# Patient Record
Sex: Male | Born: 1990 | Race: White | Hispanic: No | Smoking: Current every day smoker
Health system: Southern US, Community
[De-identification: ages and names within clinical notes are randomized; demographics above are authoritative.]

## PROBLEM LIST (undated history)

## (undated) HISTORY — PX: WRIST SURGERY: SHX841

---
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
## 2013-05-11 ENCOUNTER — Encounter (HOSPITAL_COMMUNITY): Payer: Self-pay | Admitting: *Deleted

## 2013-05-11 ENCOUNTER — Emergency Department (HOSPITAL_COMMUNITY): Payer: BC Managed Care – PPO

## 2013-05-11 ENCOUNTER — Emergency Department (HOSPITAL_COMMUNITY)
Admission: EM | Admit: 2013-05-11 | Discharge: 2013-05-11 | Disposition: A | Discharge status: Home / Self Care | Payer: BC Managed Care – PPO | Attending: Emergency Medicine | Admitting: Emergency Medicine

## 2013-05-11 DIAGNOSIS — R42 Dizziness and giddiness: Secondary | ICD-10-CM | POA: Insufficient documentation

## 2013-05-11 DIAGNOSIS — S93609A Unspecified sprain of unspecified foot, initial encounter: Secondary | ICD-10-CM | POA: Insufficient documentation

## 2013-05-11 DIAGNOSIS — F17200 Nicotine dependence, unspecified, uncomplicated: Secondary | ICD-10-CM | POA: Insufficient documentation

## 2013-05-11 DIAGNOSIS — S93602A Unspecified sprain of left foot, initial encounter: Secondary | ICD-10-CM

## 2013-05-11 DIAGNOSIS — T07XXXA Unspecified multiple injuries, initial encounter: Secondary | ICD-10-CM

## 2013-05-11 DIAGNOSIS — S0990XA Unspecified injury of head, initial encounter: Secondary | ICD-10-CM | POA: Insufficient documentation

## 2013-05-11 DIAGNOSIS — S8002XA Contusion of left knee, initial encounter: Secondary | ICD-10-CM

## 2013-05-11 DIAGNOSIS — S8000XA Contusion of unspecified knee, initial encounter: Secondary | ICD-10-CM | POA: Insufficient documentation

## 2013-05-11 DIAGNOSIS — Y92410 Unspecified street and highway as the place of occurrence of the external cause: Secondary | ICD-10-CM | POA: Insufficient documentation

## 2013-05-11 DIAGNOSIS — IMO0002 Reserved for concepts with insufficient information to code with codable children: Secondary | ICD-10-CM | POA: Insufficient documentation

## 2013-05-11 DIAGNOSIS — Y9389 Activity, other specified: Secondary | ICD-10-CM | POA: Insufficient documentation

## 2013-05-11 MED ORDER — IBUPROFEN 600 MG PO TABS: 600.0000 mg | ORAL_TABLET | Freq: Four times a day (QID) | ORAL | Status: DC | PRN

## 2013-05-11 MED ORDER — HYDROCODONE-ACETAMINOPHEN 5-325 MG PO TABS: 1.0000 | ORAL_TABLET | Freq: Four times a day (QID) | ORAL | Status: DC | PRN

## 2013-05-11 MED ORDER — LIDOCAINE-EPINEPHRINE-TETRACAINE (LET) SOLUTION
3.0000 mL | Freq: Once | NASAL | Status: AC
  Administered 2013-05-11: 3 mL via TOPICAL
  Filled 2013-05-11: qty 3

## 2013-05-11 NOTE — ED Provider Notes (Signed)
Medical screening examination/treatment/procedure(s) were performed by non-physician practitioner and as supervising physician I was immediately available for consultation/collaboration.  Dillen Belmontes N Benjamine Strout, DO 05/11/13 1652 
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
## 2013-05-12 ENCOUNTER — Telehealth: Payer: Self-pay | Admitting: Family

## 2013-05-12 NOTE — Telephone Encounter (Signed)
Please call pt and arrange an ED follow up this week.

## 2013-05-13 NOTE — Telephone Encounter (Signed)
Left message for patient to return my call.

## 2013-05-15 NOTE — Telephone Encounter (Signed)
Patient scheduled ED follow up for 05/16/13

## 2013-05-16 ENCOUNTER — Ambulatory Visit (INDEPENDENT_AMBULATORY_CARE_PROVIDER_SITE_OTHER): Payer: Self-pay | Admitting: Family

## 2013-05-16 DIAGNOSIS — Z0289 Encounter for other administrative examinations: Secondary | ICD-10-CM

## 2013-05-16 NOTE — Progress Notes (Signed)
  Subjective:    Patient ID: Daryl Wright, male    DOB: 01-Jul-1991, 22 y.o.   MRN: [id number]  HPI  Pt no showed.  Review of Systems     Objective:   Physical Exam        Assessment & Plan:

## 2013-08-30 ENCOUNTER — Emergency Department (INDEPENDENT_AMBULATORY_CARE_PROVIDER_SITE_OTHER)
Admission: EM | Admit: 2013-08-30 | Discharge: 2013-08-30 | Disposition: A | Discharge status: Home / Self Care | Payer: BC Managed Care – PPO | Source: Home / Self Care | Attending: Emergency Medicine | Admitting: Emergency Medicine

## 2013-08-30 ENCOUNTER — Encounter (HOSPITAL_COMMUNITY): Payer: Self-pay | Admitting: Emergency Medicine

## 2013-08-30 DIAGNOSIS — R091 Pleurisy: Secondary | ICD-10-CM

## 2013-08-30 DIAGNOSIS — J40 Bronchitis, not specified as acute or chronic: Secondary | ICD-10-CM

## 2013-08-30 DIAGNOSIS — J029 Acute pharyngitis, unspecified: Secondary | ICD-10-CM

## 2013-08-30 MED ORDER — SODIUM CHLORIDE 0.9 % IN NEBU
INHALATION_SOLUTION | RESPIRATORY_TRACT | Status: AC
  Filled 2013-08-30: qty 3

## 2013-08-30 MED ORDER — IPRATROPIUM BROMIDE 0.02 % IN SOLN
RESPIRATORY_TRACT | Status: AC
  Filled 2013-08-30: qty 2.5

## 2013-08-30 MED ORDER — AZITHROMYCIN 250 MG PO TABS: ORAL_TABLET | ORAL | Status: DC

## 2013-08-30 MED ORDER — ALBUTEROL SULFATE HFA 108 (90 BASE) MCG/ACT IN AERS: 1.0000 | INHALATION_SPRAY | RESPIRATORY_TRACT | Status: DC | PRN

## 2013-08-30 MED ORDER — IPRATROPIUM BROMIDE 0.02 % IN SOLN
0.5000 mg | Freq: Once | RESPIRATORY_TRACT | Status: AC
  Administered 2013-08-30: 0.5 mg via RESPIRATORY_TRACT

## 2013-08-30 MED ORDER — HYDROCOD POLST-CHLORPHEN POLST 10-8 MG/5ML PO LQCR: 5.0000 mL | Freq: Every evening | ORAL | Status: DC | PRN

## 2013-08-30 MED ORDER — ALBUTEROL SULFATE (5 MG/ML) 0.5% IN NEBU
5.0000 mg | INHALATION_SOLUTION | Freq: Once | RESPIRATORY_TRACT | Status: AC
  Administered 2013-08-30: 5 mg via RESPIRATORY_TRACT

## 2013-08-30 MED ORDER — ALBUTEROL SULFATE (5 MG/ML) 0.5% IN NEBU
INHALATION_SOLUTION | RESPIRATORY_TRACT | Status: AC
  Filled 2013-08-30: qty 1

## 2013-08-30 NOTE — ED Notes (Signed)
C/O sore throat and "difficulty expanding chest to breath".  "Feels like I have a decreased lung capacity".  C/O slight cough with yellow sputum.  Unsure if fevers.  Has taken Tyl - last dose @ 1130.  Oropharynx very red.

## 2013-08-30 NOTE — ED Provider Notes (Signed)
Medical screening examination/treatment/procedure(s) were performed by non-physician practitioner and as supervising physician I was immediately available for consultation/collaboration.  Mahagony Grieb, M.D.  Dariush Mcnellis C Hiroshi Krummel, MD 08/30/13 2057 

## 2013-08-30 NOTE — ED Notes (Signed)
?   ronchi in right lower lung.

## 2013-08-30 NOTE — ED Provider Notes (Signed)
CSN: [id number]     Arrival date & time 08/30/13  1140 History   First MD Initiated Contact with Patient 08/30/13 1255     Chief Complaint  Patient presents with  . Sore Throat  . Cough   HPI: Patient is a 22 y.o. male presenting with cough. The history is provided by the patient.  Cough Cough characteristics:  Productive Sputum characteristics:  Yellow Severity:  Severe Onset quality:  Gradual Duration:  1 week Timing:  Constant Progression:  Worsening Chronicity:  New Smoker: no   Context: upper respiratory infection   Worsened by:  Deep breathing and lying down Associated symptoms: diaphoresis, fever, myalgias, shortness of breath and sore throat   Associated symptoms: no chest pain, no chills, no ear fullness, no ear pain, no eye discharge, no rhinorrhea and no wheezing   Pt reports onset of persistent cough and sore throat approx 1 week ago that has worsened. The cough is productive of thick yellow secretions and is worse at night or when lying down. States she has coughed so much that he has pain to his upper back and lower rib area when coughing or deep breathing. States at times he feels like he can not get enough air into his lungs. Pt is non-smoker. Possible subjective fever as pt has had lots of sweating and has felt very warm at times. Denies other associated URI sx's such as runny nose, ear pain, or sinus congestion. Admits to remote h/o bronchitis. No h/o asthma.   History reviewed. No pertinent past medical history. History reviewed. No pertinent past surgical history. No family history on file. History  Substance Use Topics  . Smoking status: Former Games developer  . Smokeless tobacco: Never Used  . Alcohol Use: No    Review of Systems  Constitutional: Positive for fever and diaphoresis. Negative for chills.  HENT: Positive for sore throat. Negative for congestion, ear discharge, ear pain, facial swelling, postnasal drip, rhinorrhea, sinus pressure and sneezing.   Eyes:  Negative.  Negative for discharge.  Respiratory: Positive for cough and shortness of breath. Negative for wheezing.   Cardiovascular: Negative for chest pain.  Gastrointestinal: Negative for nausea, vomiting, abdominal pain and diarrhea.  Endocrine: Negative.   Genitourinary: Negative.   Musculoskeletal: Positive for myalgias.  Allergic/Immunologic: Negative for environmental allergies.  Neurological: Negative.   Hematological: Negative.   Psychiatric/Behavioral: Negative.     Allergies  Review of patient's allergies indicates no known allergies.  Home Medications   Current Outpatient Rx  Name  Route  Sig  Dispense  Refill  . HYDROcodone-acetaminophen (NORCO) 5-325 MG per tablet   Oral   Take 1 tablet by mouth every 6 (six) hours as needed for pain.   20 tablet   0   . ibuprofen (ADVIL,MOTRIN) 600 MG tablet   Oral   Take 1 tablet (600 mg total) by mouth every 6 (six) hours as needed for pain.   30 tablet   0    BP 145/80  Pulse 64  Temp(Src) 98 F (36.7 C) (Oral)  Resp 18  SpO2 98% Physical Exam  Constitutional: He is oriented to person, place, and time. He appears well-developed and well-nourished.  HENT:  Head: Normocephalic and atraumatic.  Right Ear: Tympanic membrane, external ear and ear canal normal.  Left Ear: Tympanic membrane, external ear and ear canal normal.  Nose: Nose normal. Right sinus exhibits no maxillary sinus tenderness and no frontal sinus tenderness. Left sinus exhibits no maxillary sinus tenderness and no frontal  sinus tenderness.  Mouth/Throat: Uvula is midline and mucous membranes are normal. Posterior oropharyngeal erythema present. No posterior oropharyngeal edema.  Eyes: Conjunctivae are normal.  Cardiovascular: Normal rate and regular rhythm.   Pulmonary/Chest: Effort normal and breath sounds normal.  Freq persistent, spastic type cough w/ deep inspiration.  Musculoskeletal: Normal range of motion.  Lymphadenopathy:    He has cervical  adenopathy.  Neurological: He is alert and oriented to person, place, and time.  Skin: Skin is warm and dry.  Psychiatric: He has a normal mood and affect.    ED Course  Procedures (including critical care time) Labs Review Labs Reviewed  POCT RAPID STREP A ([facility] ONLY)   Imaging Review No results found.  EKG Interpretation    Date/Time:    Ventricular Rate:    PR Interval:    QRS Duration:   QT Interval:    QTC Calculation:   R Axis:     Text Interpretation:              MDM  No diagnosis found. 1 week h/o persistent harsh cough and sore throat. Persistent cough on exam. Much improved after neb. Will treat for Bronchitis (Z-Pack, Albuterol HFA and short course of cough med to use at night. Pt to arrange f/u w/ PCP if not improving. Pt agreeable w/ plan.    Leanne Chang, NP 08/30/13 1428

## 2013-08-30 NOTE — ED Notes (Signed)
Breathing treatment in progress

## 2013-09-01 LAB — CULTURE, GROUP A STREP

## 2014-08-30 ENCOUNTER — Emergency Department (HOSPITAL_COMMUNITY)
Admission: EM | Admit: 2014-08-30 | Discharge: 2014-08-30 | Disposition: A | Discharge status: Home / Self Care | Payer: Self-pay | Source: Home / Self Care | Attending: Family Medicine | Admitting: Family Medicine

## 2014-08-30 ENCOUNTER — Encounter (HOSPITAL_COMMUNITY): Payer: Self-pay | Admitting: *Deleted

## 2014-08-30 DIAGNOSIS — R05 Cough: Secondary | ICD-10-CM

## 2014-08-30 DIAGNOSIS — J029 Acute pharyngitis, unspecified: Secondary | ICD-10-CM

## 2014-08-30 DIAGNOSIS — R059 Cough, unspecified: Secondary | ICD-10-CM

## 2014-08-30 LAB — POCT RAPID STREP A: Streptococcus, Group A Screen (Direct): NEGATIVE

## 2014-08-30 MED ORDER — AMOXICILLIN 500 MG PO CAPS
1000.0000 mg | ORAL_CAPSULE | Freq: Two times a day (BID) | ORAL | Status: DC
Start: 2014-08-30 — End: 2014-11-05

## 2014-08-30 MED ORDER — TRAMADOL HCL 50 MG PO TABS: 50.0000 mg | ORAL_TABLET | Freq: Every evening | ORAL | Status: DC | PRN

## 2014-08-30 NOTE — ED Notes (Signed)
C/O fevers up to 102.1, "nasty looking throat", productive cough, congestion, runny nose x 4 days.  Has been taking Tyl.  Vomited once yesterday.

## 2014-08-30 NOTE — ED Provider Notes (Signed)
Daryl MontaneDaniel J Wright is a 23 y.o. male who presents to Urgent Care today for sore throat fevers chills. He notes mild cough and congestion. The cough is productive of blood-tinged sputum. He had one episode of vomiting no diarrhea or abdominal pain.   History reviewed. No pertinent past medical history. History reviewed. No pertinent past surgical history. History  Substance Use Topics  . Smoking status: Former Games developermoker  . Smokeless tobacco: Never Used  . Alcohol Use: No   ROS as above Medications: No current facility-administered medications for this encounter.   Current Outpatient Prescriptions  Medication Sig Dispense Refill  . albuterol (PROVENTIL HFA;VENTOLIN HFA) 108 (90 BASE) MCG/ACT inhaler Inhale 1-2 puffs into the lungs every 4 (four) hours as needed for wheezing or shortness of breath (or cough). 1 Inhaler 0  . amoxicillin (AMOXIL) 500 MG capsule Take 2 capsules (1,000 mg total) by mouth 2 (two) times daily. 40 capsule 0  . traMADol (ULTRAM) 50 MG tablet Take 1 tablet (50 mg total) by mouth at bedtime as needed (cough). 10 tablet 0   No Known Allergies   Exam:  BP 133/63 mmHg  Pulse 103  Temp(Src) 98.8 F (37.1 C) (Oral)  Resp 18  SpO2 100% Gen: Well NAD HEENT: EOMI,  MMM posterior pharynx with erythema and exudate tonsil swollen bilaterally. Normal tympanic membranes bilaterally. Lungs: Normal work of breathing. CTABL Heart: RRR no MRG Abd: NABS, Soft. Nondistended, Nontender Exts: Brisk capillary refill, warm and well perfused.   Results for orders placed or performed during the hospital encounter of 08/30/14 (from the past 24 hour(s))  POCT rapid strep A [facility])     Status: None   Collection Time: 08/30/14 10:54 AM  Result Value Ref Range   Streptococcus, Group A Screen (Direct) NEGATIVE NEGATIVE   No results found.  Assessment and Plan: 23 y.o. male with tonsillitis. Treatment with amoxicillin. Tramadol for cough. Follow-up with PCP. Strep culture  pending.  Discussed warning signs or symptoms. Please see discharge instructions. Patient expresses understanding.     Rodolph BongEvan S Haniel Fix, MD 08/30/14 [id number]

## 2014-08-30 NOTE — Discharge Instructions (Signed)
Thank you for coming in today. Call or go to the emergency room if you get worse, have trouble breathing, have chest pains, or palpitations.  Do not drive after taking tramadol.   Pharyngitis Pharyngitis is redness, pain, and swelling (inflammation) of your pharynx.  CAUSES  Pharyngitis is usually caused by infection. Most of the time, these infections are from viruses (viral) and are part of a cold. However, sometimes pharyngitis is caused by bacteria (bacterial). Pharyngitis can also be caused by allergies. Viral pharyngitis may be spread from person to person by coughing, sneezing, and personal items or utensils (cups, forks, spoons, toothbrushes). Bacterial pharyngitis may be spread from person to person by more intimate contact, such as kissing.  SIGNS AND SYMPTOMS  Symptoms of pharyngitis include:   Sore throat.   Tiredness (fatigue).   Low-grade fever.   Headache.  Joint pain and muscle aches.  Skin rashes.  Swollen lymph nodes.  Plaque-like film on throat or tonsils (often seen with bacterial pharyngitis). DIAGNOSIS  Your health care provider will ask you questions about your illness and your symptoms. Your medical history, along with a physical exam, is often all that is needed to diagnose pharyngitis. Sometimes, a rapid strep test is done. Other lab tests may also be done, depending on the suspected cause.  TREATMENT  Viral pharyngitis will usually get better in 3-4 days without the use of medicine. Bacterial pharyngitis is treated with medicines that kill germs (antibiotics).  HOME CARE INSTRUCTIONS   Drink enough water and fluids to keep your urine clear or pale yellow.   Only take over-the-counter or prescription medicines as directed by your health care provider:   If you are prescribed antibiotics, make sure you finish them even if you start to feel better.   Do not take aspirin.   Get lots of rest.   Gargle with 8 oz of salt water ( tsp of salt per  1 qt of water) as often as every 1-2 hours to soothe your throat.   Throat lozenges (if you are not at risk for choking) or sprays may be used to soothe your throat. SEEK MEDICAL CARE IF:   You have large, tender lumps in your neck.  You have a rash.  You cough up green, yellow-brown, or bloody spit. SEEK IMMEDIATE MEDICAL CARE IF:   Your neck becomes stiff.  You drool or are unable to swallow liquids.  You vomit or are unable to keep medicines or liquids down.  You have severe pain that does not go away with the use of recommended medicines.  You have trouble breathing (not caused by a stuffy nose). MAKE SURE YOU:   Understand these instructions.  Will watch your condition.  Will get help right away if you are not doing well or get worse. Document Released: 09/11/2005 Document Revised: 07/02/2013 Document Reviewed: 05/19/2013 [facility] Patient Information 2015 [place], [place]. This information is not intended to replace advice given to you by your health care provider. Make sure you discuss any questions you have with your health care provider.    Cough, Adult  A cough is a reflex that helps clear your throat and airways. It can help heal the body or may be a reaction to an irritated airway. A cough may only last 2 or 3 weeks (acute) or may last more than 8 weeks (chronic).  CAUSES Acute cough:  Viral or bacterial infections. Chronic cough:  Infections.  Allergies.  Asthma.  Post-nasal drip.  Smoking.  Heartburn or  acid reflux.  Some medicines.  Chronic lung problems (COPD).  Cancer. SYMPTOMS   Cough.  Fever.  Chest pain.  Increased breathing rate.  High-pitched whistling sound when breathing (wheezing).  Colored mucus that you cough up (sputum). TREATMENT   A bacterial cough may be treated with antibiotic medicine.  A viral cough must run its course and will not respond to antibiotics.  Your caregiver may recommend other treatments if you  have a chronic cough. HOME CARE INSTRUCTIONS   Only take over-the-counter or prescription medicines for pain, discomfort, or fever as directed by your caregiver. Use cough suppressants only as directed by your caregiver.  Use a cold steam vaporizer or humidifier in your bedroom or home to help loosen secretions.  Sleep in a semi-upright position if your cough is worse at night.  Rest as needed.  Stop smoking if you smoke. SEEK IMMEDIATE MEDICAL CARE IF:   You have pus in your sputum.  Your cough starts to worsen.  You cannot control your cough with suppressants and are losing sleep.  You begin coughing up blood.  You have difficulty breathing.  You develop pain which is getting worse or is uncontrolled with medicine.  You have a fever. MAKE SURE YOU:   Understand these instructions.  Will watch your condition.  Will get help right away if you are not doing well or get worse. Document Released: 03/10/2011 Document Revised: 12/04/2011 Document Reviewed: 03/10/2011 [facility] PLLCExitCare Patient Information 2015 [place], [place]. This information is not intended to replace advice given to you by your health care provider. Make sure you discuss any questions you have with your health care provider.

## 2014-09-01 LAB — CULTURE, GROUP A STREP

## 2014-11-05 ENCOUNTER — Emergency Department (HOSPITAL_BASED_OUTPATIENT_CLINIC_OR_DEPARTMENT_OTHER)
Admission: EM | Admit: 2014-11-05 | Discharge: 2014-11-05 | Disposition: A | Discharge status: Home / Self Care | Payer: Managed Care, Other (non HMO) | Attending: Emergency Medicine | Admitting: Emergency Medicine

## 2014-11-05 ENCOUNTER — Encounter (HOSPITAL_BASED_OUTPATIENT_CLINIC_OR_DEPARTMENT_OTHER): Payer: Self-pay | Admitting: *Deleted

## 2014-11-05 DIAGNOSIS — J020 Streptococcal pharyngitis: Secondary | ICD-10-CM | POA: Insufficient documentation

## 2014-11-05 DIAGNOSIS — J029 Acute pharyngitis, unspecified: Secondary | ICD-10-CM | POA: Diagnosis present

## 2014-11-05 DIAGNOSIS — Z792 Long term (current) use of antibiotics: Secondary | ICD-10-CM | POA: Insufficient documentation

## 2014-11-05 DIAGNOSIS — Z87891 Personal history of nicotine dependence: Secondary | ICD-10-CM | POA: Insufficient documentation

## 2014-11-05 DIAGNOSIS — Z79899 Other long term (current) drug therapy: Secondary | ICD-10-CM | POA: Insufficient documentation

## 2014-11-05 LAB — RAPID STREP SCREEN (MED CTR MEBANE ONLY): Streptococcus, Group A Screen (Direct): POSITIVE — AB

## 2014-11-05 MED ORDER — DEXAMETHASONE 4 MG PO TABS
10.0000 mg | ORAL_TABLET | Freq: Once | ORAL | Status: AC
  Administered 2014-11-05: 10 mg via ORAL

## 2014-11-05 MED ORDER — DEXAMETHASONE 4 MG PO TABS
ORAL_TABLET | ORAL | Status: AC
  Filled 2014-11-05: qty 3

## 2014-11-05 MED ORDER — AMOXICILLIN 875 MG PO TABS: 875.0000 mg | ORAL_TABLET | Freq: Two times a day (BID) | ORAL | Status: DC

## 2014-11-05 NOTE — ED Provider Notes (Signed)
CSN: [id number]     Arrival date & time 11/05/14  1534 History   First MD Initiated Contact with Patient 11/05/14 1548     Chief Complaint  Patient presents with  . Sore Throat    Patient is a 24 y.o. male presenting with pharyngitis. The history is provided by the patient. No language interpreter was used.  Sore Throat   Daryl Wright presents for evaluation of sore throat. He reports 1 week of sore throat and pain with swallowing. He has associated productive cough. He has no known fevers. He had strep throat 2 months ago but he did not finish his antibiotics because he was feeling better. He has a history of multiple episodes of strep throats in the past. He denies any medical problems or allergies. Symptoms are moderate, constant, worsening.  History reviewed. No pertinent past medical history. History reviewed. No pertinent past surgical history. No family history on file. History  Substance Use Topics  . Smoking status: Former Games developermoker  . Smokeless tobacco: Never Used  . Alcohol Use: No    Review of Systems  All other systems reviewed and are negative.     Allergies  Review of patient's allergies indicates no known allergies.  Home Medications   Prior to Admission medications   Medication Sig Start Date End Date Taking? Authorizing Provider  albuterol (PROVENTIL HFA;VENTOLIN HFA) 108 (90 BASE) MCG/ACT inhaler Inhale 1-2 puffs into the lungs every 4 (four) hours as needed for wheezing or shortness of breath (or cough). 08/30/13   Roma KayserKatherine P Schorr, NP  amoxicillin (AMOXIL) 500 MG capsule Take 2 capsules (1,000 mg total) by mouth 2 (two) times daily. 08/30/14   Rodolph BongEvan S Corey, MD  traMADol (ULTRAM) 50 MG tablet Take 1 tablet (50 mg total) by mouth at bedtime as needed (cough). 08/30/14   Rodolph BongEvan S Corey, MD   BP 127/43 mmHg  Pulse 80  Temp(Src) 97.8 F (36.6 C) (Oral)  Resp 16  Ht 6\' 1"  (1.854 m)  Wt 188 lb (85.276 kg)  BMI 24.81 kg/m2  SpO2 100% Physical Exam   Constitutional: He is oriented to person, place, and time. He appears well-developed and well-nourished.  HENT:  Head: Normocephalic and atraumatic.  Left TM is secured by cerumen, right TM is without erythema or effusion. There is moderate bilateral tonsillar swelling and with small amount of exudates.  Neck:  Mild cervical LAD  Cardiovascular: Normal rate and regular rhythm.   No murmur heard. Pulmonary/Chest: Effort normal and breath sounds normal. No respiratory distress.  No stridor  Abdominal: Soft. There is no tenderness. There is no rebound and no guarding.  Musculoskeletal: He exhibits no edema or tenderness.  Neurological: He is alert and oriented to person, place, and time.  Skin: Skin is warm and dry.  Psychiatric: He has a normal mood and affect. His behavior is normal.  Nursing note and vitals reviewed.   ED Course  Procedures (including critical care time) Labs Review Labs Reviewed  RAPID STREP SCREEN - Abnormal; Notable for the following:    Streptococcus, Group A Screen (Direct) POSITIVE (*)    All other components within normal limits    Imaging Review No results found.   EKG Interpretation None      MDM   Final diagnoses:  Streptococcal pharyngitis    Patient here for evaluation of sore throat. There is no evidence of PTA, epiglottitis on exam. Discussed with patient home care for strep pharyngitis with oral fluid hydration. Providing 1 dose  of Decadron for symptomatically relief. Discussed PCP follow-up as well as return precautions.    Tilden Fossa, MD 11/05/14 [phone]

## 2014-11-05 NOTE — Discharge Instructions (Signed)

## 2014-11-05 NOTE — ED Notes (Signed)
Recent positive strep. He did not finish his antibiotics and his throat is now sore again.

## 2015-10-20 ENCOUNTER — Telehealth: Payer: Self-pay | Admitting: Radiology

## 2015-10-20 ENCOUNTER — Ambulatory Visit (INDEPENDENT_AMBULATORY_CARE_PROVIDER_SITE_OTHER): Payer: Self-pay | Admitting: Physician Assistant

## 2015-10-20 ENCOUNTER — Ambulatory Visit (HOSPITAL_COMMUNITY)
Admission: RE | Admit: 2015-10-20 | Discharge: 2015-10-20 | Disposition: A | Discharge status: Home / Self Care | Payer: Managed Care, Other (non HMO) | Source: Ambulatory Visit | Attending: Physician Assistant | Admitting: Physician Assistant

## 2015-10-20 ENCOUNTER — Telehealth: Payer: Self-pay

## 2015-10-20 VITALS — BP 112/80 | HR 74 | Temp 97.5°F | Resp 18 | Ht 72.0 in | Wt 187.4 lb

## 2015-10-20 DIAGNOSIS — H5319 Other subjective visual disturbances: Secondary | ICD-10-CM

## 2015-10-20 DIAGNOSIS — R42 Dizziness and giddiness: Secondary | ICD-10-CM | POA: Insufficient documentation

## 2015-10-20 DIAGNOSIS — S060X0A Concussion without loss of consciousness, initial encounter: Secondary | ICD-10-CM | POA: Insufficient documentation

## 2015-10-20 DIAGNOSIS — H53143 Visual discomfort, bilateral: Secondary | ICD-10-CM

## 2015-10-20 DIAGNOSIS — R110 Nausea: Secondary | ICD-10-CM

## 2015-10-20 DIAGNOSIS — R4781 Slurred speech: Secondary | ICD-10-CM | POA: Insufficient documentation

## 2015-10-20 DIAGNOSIS — J329 Chronic sinusitis, unspecified: Secondary | ICD-10-CM | POA: Insufficient documentation
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
## 2015-10-20 NOTE — Telephone Encounter (Signed)
Daryl Wright called with CT results. Relayed results to Deliah Boston, PA-C. Called pt and informed him of normal CT results. Per Deliah Boston pt can take 1,000mg  of Tylenol every 8 hours for pain. Follow up in one week to be released back to school.

## 2015-10-20 NOTE — Patient Instructions (Signed)
Go to [facility] now for your CT head    Driving directions to [facility] [id number]  [phone]  - more info    [address]  [place], [place] [address]     1. Head north on [address] toward [address]      [address]    2. Turn right onto [address]      0.3 mi    3. Slight left to stay on [address]      1.7 mi    4. Turn left onto [address]  Destination will be on the right     0.6 mi     [facility]  [address] [place]

## 2015-10-20 NOTE — Telephone Encounter (Signed)
Sent patient for STAT CT scan tonight will leave a note for referrals/ to retro the precert, patient has [facility] [place] and they closed at [facility] has sent note

## 2016-04-29 DIAGNOSIS — S6991XA Unspecified injury of right wrist, hand and finger(s), initial encounter: Secondary | ICD-10-CM | POA: Diagnosis not present

## 2016-04-29 DIAGNOSIS — S62001A Unspecified fracture of navicular [scaphoid] bone of right wrist, initial encounter for closed fracture: Secondary | ICD-10-CM | POA: Diagnosis not present

## 2016-04-29 DIAGNOSIS — M25531 Pain in right wrist: Secondary | ICD-10-CM | POA: Diagnosis not present

## 2016-04-29 DIAGNOSIS — F17210 Nicotine dependence, cigarettes, uncomplicated: Secondary | ICD-10-CM | POA: Diagnosis not present

## 2016-05-04 ENCOUNTER — Other Ambulatory Visit: Payer: Self-pay | Admitting: Orthopedic Surgery

## 2016-05-04 DIAGNOSIS — S62024A Nondisplaced fracture of middle third of navicular [scaphoid] bone of right wrist, initial encounter for closed fracture: Secondary | ICD-10-CM | POA: Diagnosis not present

## 2016-05-15 ENCOUNTER — Other Ambulatory Visit: Payer: Managed Care, Other (non HMO)

## 2016-06-01 ENCOUNTER — Inpatient Hospital Stay: Admission: RE | Admit: 2016-06-01 | Payer: Managed Care, Other (non HMO) | Source: Ambulatory Visit

## 2016-06-01 ENCOUNTER — Other Ambulatory Visit: Payer: Managed Care, Other (non HMO)

## 2017-01-10 ENCOUNTER — Emergency Department (HOSPITAL_COMMUNITY)
Admission: EM | Admit: 2017-01-10 | Discharge: 2017-01-10 | Disposition: A | Discharge status: Home / Self Care | Payer: Managed Care, Other (non HMO) | Attending: Emergency Medicine | Admitting: Emergency Medicine

## 2017-01-10 ENCOUNTER — Emergency Department (HOSPITAL_COMMUNITY): Payer: Managed Care, Other (non HMO)

## 2017-01-10 ENCOUNTER — Encounter (HOSPITAL_COMMUNITY): Payer: Self-pay | Admitting: Emergency Medicine

## 2017-01-10 DIAGNOSIS — Y939 Activity, unspecified: Secondary | ICD-10-CM | POA: Insufficient documentation

## 2017-01-10 DIAGNOSIS — Y999 Unspecified external cause status: Secondary | ICD-10-CM | POA: Insufficient documentation

## 2017-01-10 DIAGNOSIS — Y929 Unspecified place or not applicable: Secondary | ICD-10-CM | POA: Insufficient documentation

## 2017-01-10 DIAGNOSIS — S62024A Nondisplaced fracture of middle third of navicular [scaphoid] bone of right wrist, initial encounter for closed fracture: Secondary | ICD-10-CM

## 2017-01-10 DIAGNOSIS — W010XXA Fall on same level from slipping, tripping and stumbling without subsequent striking against object, initial encounter: Secondary | ICD-10-CM | POA: Insufficient documentation

## 2017-01-10 DIAGNOSIS — Z79899 Other long term (current) drug therapy: Secondary | ICD-10-CM | POA: Insufficient documentation

## 2017-01-10 DIAGNOSIS — F909 Attention-deficit hyperactivity disorder, unspecified type: Secondary | ICD-10-CM | POA: Insufficient documentation

## 2017-01-10 DIAGNOSIS — F17210 Nicotine dependence, cigarettes, uncomplicated: Secondary | ICD-10-CM | POA: Insufficient documentation

## 2017-01-10 MED ORDER — IBUPROFEN 800 MG PO TABS: 800.0000 mg | ORAL_TABLET | Freq: Three times a day (TID) | ORAL | 0 refills | Status: DC

## 2017-01-10 NOTE — ED Notes (Signed)
Patient d/c;d self care.  F/U and medications reviewed.  patient verbalized understanding. 

## 2017-01-10 NOTE — Discharge Instructions (Signed)
Medications: ibuprofen  Treatment: Take ibuprofen every 8 hours as needed for your pain. He can alternate with Tylenol as prescribed over-the-counter. Keep your splint clean and dry.  Follow-up: Please call Dr. Mina Marble office today to make an appointment within one week for follow-up and further evaluation and treatment of your scaphoid fracture. Please return to the emergency department if you develop any new or worsening symptoms.

## 2017-01-10 NOTE — ED Triage Notes (Signed)
Pt reorts that he tripped on stairs and fell down them last night. patient c/o right wrist and forearm pain. Patient repots breaking that same wrist last year.

## 2017-01-10 NOTE — ED Provider Notes (Signed)
[facility]-EMERGENCY DEPT Provider Note   CSN: [id number] Arrival date & time: 01/10/17  1225     History   Chief Complaint Chief Complaint  Patient presents with  . Fall  . Wrist Pain    HPI Daryl Wright is a 26 y.o. male with history of ADHD who presents following a fall on outstretched hand with right wrist pain. Patient states he tripped and fell down some stairs last evening and caught himself by putting his right hand out in front of him. He did not hit his head or lose consciousness. He denies any other pain. He has had intermittent paresthesias to his fingers, but no complete numbness. He does not have any paresthesias this time. Patient has used ice at home without significant relief.  HPI  No past medical history on file.  Patient Active Problem List   Diagnosis Date Noted  . Sore throat 11/27/2011  . ADHD (attention deficit hyperactivity disorder) 05/30/2011  . 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 No pertinent surgical history.     Home Medications    Prior to Admission medications   Medication Sig Start Date End Date Taking? Authorizing Provider  albuterol (PROVENTIL HFA;VENTOLIN HFA) 108 (90 BASE) MCG/ACT inhaler Inhale 1-2 puffs into the lungs every 4 (four) hours as needed for wheezing or shortness of breath (or cough). 08/30/13   Roma Kayser Schorr, NP  ibuprofen (ADVIL,MOTRIN) 800 MG tablet Take 1 tablet (800 mg total) by mouth 3 (three) times daily. 01/10/17   Emi Holes, PA-C    Family History Family History  Problem Relation Age of Onset  . Cancer Father     Social History Social History  Substance Use Topics  . Smoking status: Current Every Day Smoker    Packs/day: 0.50    Years: 1.00    Types: Cigarettes  . Smokeless tobacco: Never Used  . Alcohol use No     Allergies   Patient has no known allergies.   Review of Systems Review of Systems  Constitutional: Negative for fever.  Musculoskeletal: Positive for  arthralgias.  Skin: Negative for color change.     Physical Exam Updated Vital Signs BP (!) 144/60 (BP Location: Left Arm)   Pulse 70   Temp 97.9 F (36.6 C) (Oral)   Resp 16   Ht  (1.854 m)   Wt 90.7 kg   SpO2 100%   BMI 26.39 kg/m   Physical Exam  Constitutional: He appears well-developed and well-nourished. No distress.  HENT:  Head: Normocephalic and atraumatic.  Mouth/Throat: Oropharynx is clear and moist. No oropharyngeal exudate.  Eyes: Conjunctivae are normal. Pupils are equal, round, and reactive to light. Right eye exhibits no discharge. Left eye exhibits no discharge. No scleral icterus.  Neck: Normal range of motion. Neck supple. No thyromegaly present.  Cardiovascular: Normal rate, regular rhythm, normal heart sounds and intact distal pulses.  Exam reveals no gallop and no friction rub.   No murmur heard. Pulmonary/Chest: Effort normal and breath sounds normal. No stridor. No respiratory distress. He has no wheezes. He has no rales.  Abdominal: Soft. Bowel sounds are normal. He exhibits no distension. There is no tenderness. There is no rebound and no guarding.  Musculoskeletal: He exhibits no edema.  R hand: Anatomical snuffbox tenderness and radial wrist tenderness; no other tenderness to the hand, digits, elbow. Patient able to flex and extend all digits, however flexion is limited by pain. Abduction and abduction intact. Normal sensation, cap refill <  2secs; radial pulses intact.  Lymphadenopathy:    He has no cervical adenopathy.  Neurological: He is alert. Coordination normal.  Skin: Skin is warm and dry. No rash noted. He is not diaphoretic. No pallor.  Psychiatric: He has a normal mood and affect.  Nursing note and vitals reviewed.    ED Treatments / Results  Labs (all labs ordered are listed, but only abnormal results are displayed) Labs Reviewed - No data to display  EKG  EKG Interpretation None       Radiology Dg Forearm Right  Result  Date: 01/10/2017 CLINICAL DATA:  Radial aspect right wrist pain radiating into the forearm. The patient tripped on stairs and fell down last night. History of previous right wrist fracture. EXAM: RIGHT FOREARM - 2 VIEW COMPARISON:  None in PACs FINDINGS: The right radius and ulna are subjectively adequately mineralized. The radial head and olecranon appear normal as does the distal humerus. There is no elbow effusion. The shafts of the radius and ulna are intact. The soft tissues of the forearm are normal. The distal radius and ulna exhibit no acute abnormalities. The observed portions of the carpal bones appear normal. IMPRESSION: There is no acute or significant chronic bony abnormality of the right forearm. Electronically Signed   By: David  [place] M.D.   On: 01/10/2017 13:29   Dg Wrist Complete Right  Result Date: 01/10/2017 CLINICAL DATA:  Recent trip and fall with right wrist pain, initial encounter EXAM: RIGHT WRIST - COMPLETE 3+ VIEW COMPARISON:  None. FINDINGS: There is a midshaft fracture of the scaphoid bone without significant displacement. No other fracture is noted. No gross soft tissue abnormality is seen. IMPRESSION: Midshaft scaphoid fracture. Electronically Signed   By: Alcide Clever M.D.   On: 01/10/2017 13:33    Procedures Procedures (including critical care time)  SPLINT APPLICATION Date/Time: 2:45 PM Authorized by: Emi Holes Consent: Verbal consent obtained. Risks and benefits: risks, benefits and alternatives were discussed Consent given by: patient Splint applied by: orthopedic technician Location details: R wrist Splint type: thumb spica Supplies used: fiberglass, ACE Post-procedure: The splinted body part was neurovascularly unchanged following the procedure. Patient tolerance: Patient tolerated the procedure well with no immediate complications.     Medications Ordered in ED Medications - No data to display   Initial Impression / Assessment and Plan / ED  Course  I have reviewed the triage vital signs and the nursing notes.  Pertinent labs & imaging results that were available during my care of the patient were reviewed by me and considered in my medical decision making (see chart for details).     Patient with right midshaft scaphoid fracture. Patient is neurovascularly intact. We will place in thumb spica with follow-up to hand surgery. Patient given ibuprofen and recommend alternating with Tylenol for pain control. Splint care discussed. Return precautions discussed. Patient understands and agrees to plan. Patient was stable throughout ED course discharged in satisfactory condition.  Final Clinical Impressions(s) / ED Diagnoses   Final diagnoses:  Closed nondisplaced fracture of middle third of scaphoid bone of right wrist, initial encounter    New Prescriptions New Prescriptions   IBUPROFEN (ADVIL,MOTRIN) 800 MG TABLET    Take 1 tablet (800 mg total) by mouth 3 (three) times daily.     Emi Holes, PA-C 01/10/17 1504    Gerhard Munch, MD 01/11/17 2031

## 2019-03-26 ENCOUNTER — Emergency Department (HOSPITAL_COMMUNITY): Payer: Self-pay

## 2019-03-26 ENCOUNTER — Emergency Department (HOSPITAL_COMMUNITY)
Admission: EM | Admit: 2019-03-26 | Discharge: 2019-03-27 | Disposition: A | Discharge status: Home / Self Care | Payer: Self-pay | Attending: Emergency Medicine | Admitting: Emergency Medicine

## 2019-03-26 ENCOUNTER — Encounter (HOSPITAL_COMMUNITY): Payer: Self-pay | Admitting: Emergency Medicine

## 2019-03-26 DIAGNOSIS — Y939 Activity, unspecified: Secondary | ICD-10-CM | POA: Insufficient documentation

## 2019-03-26 DIAGNOSIS — T17908A Unspecified foreign body in respiratory tract, part unspecified causing other injury, initial encounter: Secondary | ICD-10-CM | POA: Insufficient documentation

## 2019-03-26 DIAGNOSIS — Y999 Unspecified external cause status: Secondary | ICD-10-CM | POA: Insufficient documentation

## 2019-03-26 DIAGNOSIS — F10920 Alcohol use, unspecified with intoxication, uncomplicated: Secondary | ICD-10-CM | POA: Insufficient documentation

## 2019-03-26 DIAGNOSIS — X58XXXA Exposure to other specified factors, initial encounter: Secondary | ICD-10-CM | POA: Insufficient documentation

## 2019-03-26 DIAGNOSIS — Y929 Unspecified place or not applicable: Secondary | ICD-10-CM | POA: Insufficient documentation

## 2019-03-26 LAB — COMPREHENSIVE METABOLIC PANEL
ALT: 19 U/L (ref 0–44)
AST: 33 U/L (ref 15–41)
Albumin: 4.4 g/dL (ref 3.5–5.0)
Alkaline Phosphatase: 44 U/L (ref 38–126)
Anion gap: 11 (ref 5–15)
BUN: 6 mg/dL (ref 6–20)
CO2: 20 mmol/L — ABNORMAL LOW (ref 22–32)
Calcium: 8.6 mg/dL — ABNORMAL LOW (ref 8.9–10.3)
Chloride: 113 mmol/L — ABNORMAL HIGH (ref 98–111)
Creatinine, Ser: 0.94 mg/dL (ref 0.61–1.24)
GFR calc Af Amer: >60 mL/min (ref >60)
GFR calc non Af Amer: >60 mL/min (ref >60)
Glucose, Bld: 91 mg/dL (ref 70–99)
Potassium: 4.5 mmol/L (ref 3.5–5.1)
Sodium: 144 mmol/L (ref 135–145)
Total Bilirubin: 0.8 mg/dL (ref 0.3–1.2)
Total Protein: 7.2 g/dL (ref 6.5–8.1)

## 2019-03-26 LAB — CBC WITH DIFFERENTIAL/PLATELET
Abs Immature Granulocytes: 0.05 10*3/uL (ref 0.00–0.07)
Basophils Absolute: 0.1 10*3/uL (ref 0.0–0.1)
Basophils Relative: 1 %
Eosinophils Absolute: 0 10*3/uL (ref 0.0–0.5)
Eosinophils Relative: 0 %
HCT: 43.7 % (ref 39.0–52.0)
Hemoglobin: 14.5 g/dL (ref 13.0–17.0)
Immature Granulocytes: 1 %
Lymphocytes Relative: 14 %
Lymphs Abs: 1.5 10*3/uL (ref 0.7–4.0)
MCH: 31 pg (ref 26.0–34.0)
MCHC: 33.2 g/dL (ref 30.0–36.0)
MCV: 93.6 fL (ref 80.0–100.0)
Monocytes Absolute: 0.6 10*3/uL (ref 0.1–1.0)
Monocytes Relative: 5 %
Neutro Abs: 8.8 10*3/uL — ABNORMAL HIGH (ref 1.7–7.7)
Neutrophils Relative %: 79 %
Platelets: 253 10*3/uL (ref 150–400)
RBC: 4.67 MIL/uL (ref 4.22–5.81)
RDW: 13 % (ref 11.5–15.5)
WBC: 11 10*3/uL — ABNORMAL HIGH (ref 4.0–10.5)
nRBC: 0 % (ref 0.0–0.2)

## 2019-03-26 LAB — ACETAMINOPHEN LEVEL: Acetaminophen (Tylenol), Serum: <10 ug/mL — ABNORMAL LOW (ref 10–30)

## 2019-03-26 LAB — RAPID URINE DRUG SCREEN, HOSP PERFORMED
Amphetamines: NOT DETECTED
Barbiturates: NOT DETECTED
Benzodiazepines: NOT DETECTED
Cocaine: POSITIVE — AB
Opiates: NOT DETECTED
Tetrahydrocannabinol: POSITIVE — AB

## 2019-03-26 LAB — SALICYLATE LEVEL: Salicylate Lvl: <7 mg/dL (ref 2.8–30.0)

## 2019-03-26 LAB — ETHANOL: Alcohol, Ethyl (B): 265 mg/dL — ABNORMAL HIGH (ref <10)

## 2019-03-26 NOTE — ED Triage Notes (Signed)
Pt here via [facility] from [facility] [employer]  His friends called 29 b/c he was "having a seizure".  Initially pt was lethargic but ambulatory and "staggered" to ambulance.  Pt became unresponsive briefly in ambulance.  Mother came to the scene and stated pt took Klonipin, etoh, cocaine, xanax.  Given 4 zofran en-route. CBG 78, bp 130/70, O2 95-99%.  Upon arrival pt vomiting, flailing all limbs, panting.  Pt able to follow commands. Pupils 4 and reactive.

## 2019-03-26 NOTE — ED Notes (Signed)
Called mother.  She is not answering phone.

## 2019-03-26 NOTE — ED Notes (Signed)
Pt to remain here and MTF. Mother leaving and requests we call when the time comes and she will come back to pick pt up.

## 2019-03-27 ENCOUNTER — Encounter (HOSPITAL_COMMUNITY): Payer: Self-pay | Admitting: Emergency Medicine

## 2019-03-27 NOTE — ED Notes (Signed)
E-Signature not available, verbalized DC instructions and follow up care.

## 2019-03-27 NOTE — Discharge Instructions (Signed)
Return to the emergency department for any new and/or concerning symptoms.  The alcohol and substance abuse treatment resource guide has been provided in this discharge summary should you desire to pursue this possibility.

## 2019-03-27 NOTE — ED Provider Notes (Signed)
Care assumed from Dr. Gilford Raid at shift change.  Patient brought here intoxicated from a local establishment.  Patient's blood alcohol is nearly 300 and tox screen is positive for multiple substances.  Patient initially somnolent, but allowed to metabolize here in the ER.  He is now awake alert and ambulatory.  He appears stable for discharge.   Veryl Speak, MD 03/27/19 [phone]

## 2019-03-27 NOTE — ED Notes (Signed)
Pt ambulated with steady gait. EDP aware. Mother en route to pick up pt.

## 2019-04-06 NOTE — ED Provider Notes (Signed)
[facility] Note   CSN: [id number] Arrival date & time: 03/26/19  1733     History   Chief Complaint Chief Complaint  Patient presents with  . Altered Mental Status    HPI Daryl Wright is a 28 y.o. male.     HPI   28 year old male presents with concern for altered mental mental status and possible seizure-like activity from [employer].  His friends called 911 because he was "having a seizure."  Per EMS, patient staggered to the ambulance, but became unresponsive briefly while in the ambulance.  Patient is somnolent and history is limited, but he does report he took Klonopin, Xanax, drink alcohol, and took cocaine.  Denies SI. No CP or dyspnea.   History limited by altered mental status.  History reviewed. No pertinent past medical history.  Patient Active Problem List   Diagnosis Date Noted  . Sore throat 11/27/2011  . ADHD (attention deficit hyperactivity disorder) 05/30/2011  . 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 No pertinent surgical history.      Home Medications    Prior to Admission medications   Medication Sig Start Date End Date Taking? Authorizing Provider  albuterol (PROVENTIL HFA;VENTOLIN HFA) 108 (90 BASE) MCG/ACT inhaler Inhale 1-2 puffs into the lungs every 4 (four) hours as needed for wheezing or shortness of breath (or cough). 08/30/13   Schorr, Roma KayserKatherine P, NP  ibuprofen (ADVIL,MOTRIN) 800 MG tablet Take 1 tablet (800 mg total) by mouth 3 (three) times daily. 01/10/17   Emi HolesLaw, Alexandra M, PA-C    Family History Family History  Problem Relation Age of Onset  . Cancer Father     Social History Social History   Tobacco Use  . Smoking status: Current Every Day Smoker  . Smokeless tobacco: Never Used  Substance Use Topics  . Alcohol use: Yes  . Drug use: Yes    Types: Marijuana, Cocaine     Allergies   Patient has no known allergies.   Review of Systems Review of  Systems  Unable to perform ROS: Mental status change  Constitutional: Negative for fever.  HENT: Negative for sore throat.   Eyes: Negative for visual disturbance.  Respiratory: Negative for shortness of breath.   Cardiovascular: Negative for chest pain.  Gastrointestinal: Negative for abdominal pain.  Neurological: Positive for syncope. Seizures: seizure like activity per bystanders.     Physical Exam Updated Vital Signs BP (!) 100/54   Pulse (!) 58   Temp 97.6 F (36.4 C) (Temporal)   Resp 15   SpO2 100%   Physical Exam Vitals signs and nursing note reviewed.  Constitutional:      General: He is not in acute distress.    Appearance: He is well-developed. He is ill-appearing. He is not diaphoretic.     Comments: somnolent  HENT:     Head: Normocephalic and atraumatic.  Eyes:     Conjunctiva/sclera: Conjunctivae normal.  Neck:     Musculoskeletal: Normal range of motion.  Cardiovascular:     Rate and Rhythm: Normal rate and regular rhythm.     Heart sounds: Normal heart sounds. No murmur. No friction rub. No gallop.   Pulmonary:     Effort: Pulmonary effort is normal. No respiratory distress.     Breath sounds: Normal breath sounds. No wheezing or rales.  Abdominal:     General: There is no distension.     Palpations: Abdomen is soft.  Tenderness: There is no abdominal tenderness. There is no guarding.  Skin:    General: Skin is warm and dry.  Neurological:     GCS: GCS eye subscore is 3. GCS verbal subscore is 5. GCS motor subscore is 6.     Sensory: No sensory deficit.     Motor: Motor function is intact. No weakness.     Comments: Somnolent, Requires a lot of stimulation but able to answer a few questions when awoken       ED Treatments / Results  Labs (all labs ordered are listed, but only abnormal results are displayed) Labs Reviewed  COMPREHENSIVE METABOLIC PANEL - Abnormal; Notable for the following components:      Result Value   Chloride 113  (*)    CO2 20 (*)    Calcium 8.6 (*)    All other components within normal limits  ETHANOL - Abnormal; Notable for the following components:   Alcohol, Ethyl (B) 265 (*)    All other components within normal limits  RAPID URINE DRUG SCREEN, HOSP PERFORMED - Abnormal; Notable for the following components:   Cocaine POSITIVE (*)    Tetrahydrocannabinol POSITIVE (*)    All other components within normal limits  CBC WITH DIFFERENTIAL/PLATELET - Abnormal; Notable for the following components:   WBC 11.0 (*)    Neutro Abs 8.8 (*)    All other components within normal limits  ACETAMINOPHEN LEVEL - Abnormal; Notable for the following components:   Acetaminophen (Tylenol), Serum <10 (*)    All other components within normal limits  SALICYLATE LEVEL    EKG EKG Interpretation  Date/Time:  Wednesday March 26 2019 17:55:59 EDT Ventricular Rate:  103 PR Interval:    QRS Duration: 117 QT Interval:  411 QTC Calculation: 538 R Axis:   90 Text Interpretation:  Sinus tachycardia LAE, consider biatrial enlargement Incomplete right bundle branch block Extensive anterior infarct, acute (LAD) Baseline wander in lead(s) III aVL aVF V6 >>> Acute MI <<< No old tracing to compare Serial tracing suggested Confirmed by Daleen Bo [phone]) on 03/27/2019 12:10:58 PM   Radiology No results found.  Procedures Procedures (including critical care time)  Medications Ordered in ED Medications - No data to display   Initial Impression / Assessment and Plan / ED Course  I have reviewed the triage vital signs and the nursing notes.  Pertinent labs & imaging results that were available during my care of the patient were reviewed by me and considered in my medical decision making (see chart for details).        28 year old male presents with concern for altered mental mental status and possible seizure-like activity from [employer] after using etoh, klonopin, xanax, cocaine.  CT head WO acute  abnormalities. CXR without signs of aspiration. Labs show ETOH without other significant acute abnormalities.  Suspect if patient did have seizure like activity this was in the setting of polysubstance abuse. He arrives somnolent, however is protecting his airway. Placed on ET CO2 for monitoring.  Plan to observe as he metabolizes polysubstances. Care signed out to Dr. Gilford Raid with reeval pending.   Final Clinical Impressions(s) / ED Diagnoses   Final diagnoses:  Aspiration into airway  Acute alcoholic intoxication without complication [facility])    ED Discharge Orders    None       Gareth Morgan, MD 04/06/19 [phone]

## 2019-07-17 ENCOUNTER — Ambulatory Visit (HOSPITAL_COMMUNITY)
Admission: EM | Admit: 2019-07-17 | Discharge: 2019-07-17 | Disposition: A | Discharge status: Home / Self Care | Payer: Self-pay | Attending: Family Medicine | Admitting: Family Medicine

## 2019-07-17 ENCOUNTER — Other Ambulatory Visit: Payer: Self-pay

## 2019-07-17 ENCOUNTER — Ambulatory Visit (INDEPENDENT_AMBULATORY_CARE_PROVIDER_SITE_OTHER): Payer: Self-pay

## 2019-07-17 ENCOUNTER — Encounter (HOSPITAL_COMMUNITY): Payer: Self-pay

## 2019-07-17 DIAGNOSIS — M87039 Idiopathic aseptic necrosis of unspecified carpus: Secondary | ICD-10-CM

## 2019-07-17 DIAGNOSIS — W19XXXA Unspecified fall, initial encounter: Secondary | ICD-10-CM

## 2019-07-17 DIAGNOSIS — S6991XA Unspecified injury of right wrist, hand and finger(s), initial encounter: Secondary | ICD-10-CM

## 2019-07-17 MED ORDER — IBUPROFEN 800 MG PO TABS
ORAL_TABLET | ORAL | Status: AC
  Filled 2019-07-17: qty 1

## 2019-07-17 MED ORDER — IBUPROFEN 800 MG PO TABS: 800.0000 mg | ORAL_TABLET | Freq: Three times a day (TID) | ORAL | 0 refills | Status: DC

## 2019-07-17 MED ORDER — IBUPROFEN 800 MG PO TABS
800.0000 mg | ORAL_TABLET | Freq: Once | ORAL | Status: AC
  Administered 2019-07-17: 16:00:00 800 mg via ORAL

## 2019-07-17 NOTE — ED Provider Notes (Signed)
[facility]    CSN: [id number] Arrival date & time: 07/17/19  1421      History   Chief Complaint Chief Complaint  Patient presents with  . Wrist Injury    Right    HPI Daryl Wright is a 28 y.o. male no significant past medical history presenting today for evaluation of right wrist injury.  Last night patient fell backwards and caught himself with his right hand.  Since he has had pain in his wrist similar to when he has previously had scaphoid fractures.  He notes that he has injured his scaphoid twice previously.  Most recent was 12/2016.  He has not followed up with orthopedics since, but states that he does have intermittent pain in this area. Has not taken anything for his pain and swelling.  HPI  History reviewed. No pertinent past medical history.  Patient Active Problem List   Diagnosis Date Noted  . Sore throat 11/27/2011  . ADHD (attention deficit hyperactivity disorder) 05/30/2011  . 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 No pertinent surgical history.     Home Medications    Prior to Admission medications   Medication Sig Start Date End Date Taking? Authorizing Provider  albuterol (PROVENTIL HFA;VENTOLIN HFA) 108 (90 BASE) MCG/ACT inhaler Inhale 1-2 puffs into the lungs every 4 (four) hours as needed for wheezing or shortness of breath (or cough). 08/30/13   Schorr, Roma Kayser, NP  ibuprofen (ADVIL) 800 MG tablet Take 1 tablet (800 mg total) by mouth 3 (three) times daily. 07/17/19   Uriyah Massimo, Junius Creamer, PA-C    Family History Family History  Problem Relation Age of Onset  . Cancer Father   . Healthy Mother     Social History Social History   Tobacco Use  . Smoking status: Current Every Day Smoker    Packs/day: 0.50    Types: Cigarettes  . Smokeless tobacco: Never Used  Substance Use Topics  . Alcohol use: Yes    Comment: socailly  . Drug use: Yes    Types: Marijuana, Cocaine    Comment: denies     Allergies    Patient has no known allergies.   Review of Systems Review of Systems  Constitutional: Negative for fatigue and fever.  Eyes: Negative for redness, itching and visual disturbance.  Respiratory: Negative for shortness of breath.   Cardiovascular: Negative for chest pain and leg swelling.  Gastrointestinal: Negative for nausea and vomiting.  Musculoskeletal: Positive for arthralgias and joint swelling. Negative for myalgias.  Skin: Negative for color change, rash and wound.  Neurological: Negative for dizziness, syncope, weakness, light-headedness and headaches.     Physical Exam Triage Vital Signs ED Triage Vitals  Enc Vitals Group     BP 07/17/19 1527 126/68     Pulse Rate 07/17/19 1527 65     Resp 07/17/19 1527 15     Temp 07/17/19 1527 98.4 F (36.9 C)     Temp Source 07/17/19 1527 Oral     SpO2 07/17/19 1527 100 %     Weight --      Height --      Head Circumference --      Peak Flow --      Pain Score 07/17/19 1526 5     Pain Loc --      Pain Edu? --      Excl. in GC? --    No data found.  Updated Vital Signs BP 126/68 (BP Location:  Left Arm)   Pulse 65   Temp 98.4 F (36.9 C) (Oral)   Resp 15   SpO2 100%   Visual Acuity Right Eye Distance:   Left Eye Distance:   Bilateral Distance:    Right Eye Near:   Left Eye Near:    Bilateral Near:     Physical Exam Vitals signs and nursing note reviewed.  Constitutional:      Appearance: He is well-developed.     Comments: No acute distress  HENT:     Head: Normocephalic and atraumatic.     Nose: Nose normal.  Eyes:     Conjunctiva/sclera: Conjunctivae normal.  Neck:     Musculoskeletal: Neck supple.  Cardiovascular:     Rate and Rhythm: Normal rate.  Pulmonary:     Effort: Pulmonary effort is normal. No respiratory distress.  Abdominal:     General: There is no distension.  Musculoskeletal: Normal range of motion.     Comments: Right hand/wrist: No obvious deformity swelling or discoloration,  tender to palpation of distal radius extending into first metacarpal and snuffbox.  Nontender to distal ulna or over second through fifth metacarpals.  Full active range of motion of all fingers. Radial pulse 2+, cap refill brisk, sensation intact distally  Skin:    General: Skin is warm and dry.  Neurological:     Mental Status: He is alert and oriented to person, place, and time.      UC Treatments / Results  Labs (all labs ordered are listed, but only abnormal results are displayed) Labs Reviewed - No data to display  EKG   Radiology Dg Wrist Complete Right  Result Date: 07/17/2019 CLINICAL DATA:  Acute right wrist pain after fall. EXAM: RIGHT WRIST - COMPLETE 3+ VIEW COMPARISON:  January 10, 2017. FINDINGS: No acute fracture or dislocation is noted. Lucency and sclerosis is seen involving the proximal pole of the scaphoid bone consistent with avascular necrosis. Joint spaces are intact. No soft tissue abnormality is noted. 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 No acute abnormality is noted. Electronically Signed   By: Lupita RaiderJames  Green Jr M.D.   On: 07/17/2019 16:06    Procedures Procedures (including critical care time)  Medications Ordered in UC Medications  ibuprofen (ADVIL) tablet 800 mg (800 mg Oral Given 07/17/19 1551)    Initial Impression / Assessment and Plan / UC Course  I have reviewed the triage vital signs and the nursing notes.  Pertinent labs & imaging results that were available during my care of the patient were reviewed by me and considered in my medical decision making (see chart for details).     X-ray negative for acute fracture, x-ray suggestive of avascular necrosis of scaphoid from previous scaphoid injuries.  Will place in thumb spica and have follow-up with hand.  Provided Dr. Merlyn LotKuzma, Dr. On call contact information to follow-up.  Continue anti-inflammatories for pain and swelling, ice.  Discussed strict return  precautions. Patient verbalized understanding and is agreeable with plan.  Final Clinical Impressions(s) / UC Diagnoses   Final diagnoses:  Injury of right wrist, initial encounter  Avascular necrosis of scaphoid [facility](HCC)     Discharge Instructions     Please follow up with Dr. Merlyn LotKuzma for further evaluation of Wrist pain and past scaphoid injuries.   Use anti-inflammatories for pain/swelling. You may take up to 800 mg Ibuprofen every 8 hours with food. You may supplement Ibuprofen with Tylenol [phone] mg every 8 hours.  Wear thumb spica for the next 2 weeks until following up with ortho/hand.     ED Prescriptions    Medication Sig Dispense Auth. Provider   ibuprofen (ADVIL) 800 MG tablet Take 1 tablet (800 mg total) by mouth 3 (three) times daily. 21 tablet Emiliano Welshans, [place] C, PA-C     PDMP not reviewed this encounter.   Janith Lima, [place] 07/17/19 [phone]

## 2019-07-17 NOTE — ED Triage Notes (Signed)
Patient presents to Urgent Care with complaints of right wrist pain since falling on it last night. Patient reports he has broken his scaphoid bone twice in the past, thinks he further injured it.

## 2019-07-17 NOTE — Discharge Instructions (Signed)
Please follow up with Dr. Fredna Dow for further evaluation of Wrist pain and past scaphoid injuries.   Use anti-inflammatories for pain/swelling. You may take up to 800 mg Ibuprofen every 8 hours with food. You may supplement Ibuprofen with Tylenol [phone] mg every 8 hours.   Wear thumb spica for the next 2 weeks until following up with ortho/hand.

## 2019-09-01 ENCOUNTER — Other Ambulatory Visit: Payer: Self-pay | Admitting: Orthopedic Surgery

## 2019-09-09 ENCOUNTER — Other Ambulatory Visit (HOSPITAL_COMMUNITY): Admission: RE | Admit: 2019-09-09 | Payer: BC Managed Care – PPO | Source: Ambulatory Visit

## 2019-09-10 ENCOUNTER — Other Ambulatory Visit: Payer: Self-pay | Admitting: Orthopedic Surgery

## 2019-09-12 ENCOUNTER — Ambulatory Visit (HOSPITAL_BASED_OUTPATIENT_CLINIC_OR_DEPARTMENT_OTHER)
Admission: RE | Admit: 2019-09-12 | Payer: BC Managed Care – PPO | Source: Home / Self Care | Admitting: Orthopedic Surgery

## 2019-09-12 ENCOUNTER — Encounter (HOSPITAL_BASED_OUTPATIENT_CLINIC_OR_DEPARTMENT_OTHER): Admission: RE | Payer: Self-pay | Source: Home / Self Care

## 2019-09-12 SURGERY — OPEN REDUCTION INTERNAL FIXATION (ORIF) HAND
Anesthesia: Monitor Anesthesia Care | Site: Hand | Laterality: Right

## 2020-09-05 ENCOUNTER — Other Ambulatory Visit: Payer: Self-pay

## 2020-09-05 ENCOUNTER — Ambulatory Visit (HOSPITAL_COMMUNITY)
Admission: AD | Admit: 2020-09-05 | Discharge: 2020-09-05 | Disposition: A | Discharge status: Home / Self Care | Payer: No Payment, Other | Source: Home / Self Care | Attending: Psychiatry | Admitting: Psychiatry

## 2020-09-05 ENCOUNTER — Ambulatory Visit (HOSPITAL_COMMUNITY)
Admission: EM | Admit: 2020-09-05 | Discharge: 2020-09-06 | Disposition: A | Discharge status: Home / Self Care | Payer: No Payment, Other | Attending: Psychiatry | Admitting: Psychiatry

## 2020-09-05 ENCOUNTER — Encounter (HOSPITAL_COMMUNITY): Payer: Self-pay | Admitting: Emergency Medicine

## 2020-09-05 DIAGNOSIS — G4700 Insomnia, unspecified: Secondary | ICD-10-CM | POA: Insufficient documentation

## 2020-09-05 DIAGNOSIS — Z9151 Personal history of suicidal behavior: Secondary | ICD-10-CM | POA: Insufficient documentation

## 2020-09-05 DIAGNOSIS — R4587 Impulsiveness: Secondary | ICD-10-CM | POA: Insufficient documentation

## 2020-09-05 DIAGNOSIS — F419 Anxiety disorder, unspecified: Secondary | ICD-10-CM | POA: Insufficient documentation

## 2020-09-05 DIAGNOSIS — F321 Major depressive disorder, single episode, moderate: Secondary | ICD-10-CM

## 2020-09-05 DIAGNOSIS — R45851 Suicidal ideations: Secondary | ICD-10-CM | POA: Insufficient documentation

## 2020-09-05 DIAGNOSIS — R269 Unspecified abnormalities of gait and mobility: Secondary | ICD-10-CM | POA: Insufficient documentation

## 2020-09-05 DIAGNOSIS — F17210 Nicotine dependence, cigarettes, uncomplicated: Secondary | ICD-10-CM | POA: Insufficient documentation

## 2020-09-05 DIAGNOSIS — R450 Nervousness: Secondary | ICD-10-CM | POA: Insufficient documentation

## 2020-09-05 DIAGNOSIS — F332 Major depressive disorder, recurrent severe without psychotic features: Secondary | ICD-10-CM | POA: Insufficient documentation

## 2020-09-05 DIAGNOSIS — Z20822 Contact with and (suspected) exposure to covid-19: Secondary | ICD-10-CM | POA: Insufficient documentation

## 2020-09-05 LAB — POCT URINE DRUG SCREEN - MANUAL ENTRY (I-SCREEN)
POC Amphetamine UR: NOT DETECTED
POC Buprenorphine (BUP): NOT DETECTED
POC Cocaine UR: NOT DETECTED
POC Marijuana UR: POSITIVE — AB
POC Methadone UR: NOT DETECTED
POC Methamphetamine UR: NOT DETECTED
POC Morphine: NOT DETECTED
POC Oxazepam (BZO): NOT DETECTED
POC Oxycodone UR: NOT DETECTED
POC Secobarbital (BAR): NOT DETECTED

## 2020-09-05 LAB — LIPID PANEL
Cholesterol: 154 mg/dL (ref 0–200)
HDL: 64 mg/dL (ref >40)
LDL Cholesterol: 72 mg/dL (ref 0–99)
Total CHOL/HDL Ratio: 2.4 RATIO
Triglycerides: 92 mg/dL (ref <150)
VLDL: 18 mg/dL (ref 0–40)

## 2020-09-05 LAB — CBC WITH DIFFERENTIAL/PLATELET
Abs Immature Granulocytes: 0.01 10*3/uL (ref 0.00–0.07)
Basophils Absolute: 0.1 10*3/uL (ref 0.0–0.1)
Basophils Relative: 1 %
Eosinophils Absolute: 0.1 10*3/uL (ref 0.0–0.5)
Eosinophils Relative: 1 %
HCT: 46.4 % (ref 39.0–52.0)
Hemoglobin: 14.7 g/dL (ref 13.0–17.0)
Immature Granulocytes: 0 %
Lymphocytes Relative: 32 %
Lymphs Abs: 2.4 10*3/uL (ref 0.7–4.0)
MCH: 30.1 pg (ref 26.0–34.0)
MCHC: 31.7 g/dL (ref 30.0–36.0)
MCV: 95.1 fL (ref 80.0–100.0)
Monocytes Absolute: 0.8 10*3/uL (ref 0.1–1.0)
Monocytes Relative: 10 %
Neutro Abs: 4.1 10*3/uL (ref 1.7–7.7)
Neutrophils Relative %: 56 %
Platelets: 227 10*3/uL (ref 150–400)
RBC: 4.88 MIL/uL (ref 4.22–5.81)
RDW: 12.8 % (ref 11.5–15.5)
WBC: 7.3 10*3/uL (ref 4.0–10.5)
nRBC: 0 % (ref 0.0–0.2)

## 2020-09-05 LAB — COMPREHENSIVE METABOLIC PANEL
ALT: 17 U/L (ref 0–44)
AST: 18 U/L (ref 15–41)
Albumin: 4.3 g/dL (ref 3.5–5.0)
Alkaline Phosphatase: 45 U/L (ref 38–126)
Anion gap: 9 (ref 5–15)
BUN: 6 mg/dL (ref 6–20)
CO2: 27 mmol/L (ref 22–32)
Calcium: 9.8 mg/dL (ref 8.9–10.3)
Chloride: 103 mmol/L (ref 98–111)
Creatinine, Ser: 0.76 mg/dL (ref 0.61–1.24)
GFR, Estimated: >60 mL/min (ref >60)
Glucose, Bld: 86 mg/dL (ref 70–99)
Potassium: 3.9 mmol/L (ref 3.5–5.1)
Sodium: 139 mmol/L (ref 135–145)
Total Bilirubin: 0.5 mg/dL (ref 0.3–1.2)
Total Protein: 7.5 g/dL (ref 6.5–8.1)

## 2020-09-05 LAB — POC SARS CORONAVIRUS 2 AG: SARS Coronavirus 2 Ag: NEGATIVE

## 2020-09-05 LAB — HEMOGLOBIN A1C
Hgb A1c MFr Bld: 5.3 % (ref 4.8–5.6)
Mean Plasma Glucose: 105.41 mg/dL

## 2020-09-05 LAB — MAGNESIUM: Magnesium: 2 mg/dL (ref 1.7–2.4)

## 2020-09-05 LAB — ETHANOL: Alcohol, Ethyl (B): <10 mg/dL (ref <10)

## 2020-09-05 LAB — TSH: TSH: 2.102 u[IU]/mL (ref 0.350–4.500)

## 2020-09-05 LAB — RESP PANEL BY RT-PCR (FLU A&B, COVID) ARPGX2
Influenza A by PCR: NEGATIVE
Influenza B by PCR: NEGATIVE
SARS Coronavirus 2 by RT PCR: NEGATIVE

## 2020-09-05 LAB — POC SARS CORONAVIRUS 2 AG -  ED: SARS Coronavirus 2 Ag: NEGATIVE

## 2020-09-05 MED ORDER — ACETAMINOPHEN 325 MG PO TABS: 650.0000 mg | ORAL_TABLET | Freq: Four times a day (QID) | ORAL | Status: DC | PRN

## 2020-09-05 MED ORDER — MAGNESIUM HYDROXIDE 400 MG/5ML PO SUSP: 30.0000 mL | Freq: Every day | ORAL | Status: DC | PRN

## 2020-09-05 MED ORDER — ALUM & MAG HYDROXIDE-SIMETH 200-200-20 MG/5ML PO SUSP: 30.0000 mL | ORAL | Status: DC | PRN

## 2020-09-05 MED ORDER — TRAZODONE HCL 50 MG PO TABS
50.0000 mg | ORAL_TABLET | Freq: Every evening | ORAL | Status: DC | PRN
  Administered 2020-09-05: 21:00:00 50 mg via ORAL
  Filled 2020-09-05: qty 1
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
## 2020-09-05 NOTE — ED Notes (Signed)
Pt sleeping@this time. breathing even and unlabored. Will continue to monitor for safety 

## 2020-09-05 NOTE — ED Triage Notes (Signed)
Pt transferred from [facility], presents with complaint of suicidal ideations, no plan noted.  Denies at present.  Denies HI, hears voices.
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
## 2020-09-06 LAB — GLUCOSE, CAPILLARY: Glucose-Capillary: 86 mg/dL (ref 70–99)

## 2020-09-06 NOTE — Discharge Instructions (Addendum)
Please come to [facility] (this facility) during walk in hours for appointment with psychiatrist for further medication management. .  Walk in hours are 8-11 AM Monday through Thursday. There is often a wait, and it is best to arrive by 7:30 AM.   Therapy walk in hours on Fridays 1pm- 4 pm.   When you arrive for your walk in appointment, inform the front desk that you are here for your walk in appointment upstairs.  Address:  [address] in [place], [address] Ph: [phone]  In the event of worsening symptoms, patient is instructed to call the crisis hotline, 911 and or go to the nearest ED for appropriate evaluation and treatment of symptoms. To follow-up with his/her primary care provider for your other medical issues, concerns and or health care needs.

## 2020-09-06 NOTE — ED Notes (Signed)
Pt sleeping@this time. Breathing even and unlabored. Will continue to monitor for safety 

## 2020-09-06 NOTE — ED Notes (Signed)
Educated pt on avs and follow up apts. Verbalized understanding. Called safe transport. Benedetto Goad being sent. Escorted pt to retrieve belongings. Ambulated per self. No new issues noted. A&O x4. No SI, HI, or AVH. Escorted pt to front lobby to wait on Benedetto Goad arriving shortly. Pt stable upon d/c

## 2020-09-06 NOTE — ED Notes (Signed)
Pt resting with eyes closed. Rise and fall of chest noted. No new issues noted at this time. Will continue to monitor for safety 
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
## 2020-09-07 LAB — PROLACTIN: Prolactin: 7.7 ng/mL (ref 4.0–15.2)

## 2021-06-20 ENCOUNTER — Other Ambulatory Visit: Payer: Self-pay

## 2021-06-20 ENCOUNTER — Ambulatory Visit (HOSPITAL_COMMUNITY)
Admission: EM | Admit: 2021-06-20 | Discharge: 2021-06-20 | Disposition: A | Discharge status: Home / Self Care | Payer: No Payment, Other | Attending: Psychiatry | Admitting: Psychiatry

## 2021-06-20 DIAGNOSIS — F1290 Cannabis use, unspecified, uncomplicated: Secondary | ICD-10-CM | POA: Insufficient documentation

## 2021-06-20 DIAGNOSIS — Z62810 Personal history of physical and sexual abuse in childhood: Secondary | ICD-10-CM | POA: Insufficient documentation

## 2021-06-20 DIAGNOSIS — F1911 Other psychoactive substance abuse, in remission: Secondary | ICD-10-CM | POA: Insufficient documentation

## 2021-06-20 DIAGNOSIS — R45851 Suicidal ideations: Secondary | ICD-10-CM | POA: Insufficient documentation

## 2021-06-20 DIAGNOSIS — Z7289 Other problems related to lifestyle: Secondary | ICD-10-CM | POA: Insufficient documentation

## 2021-06-20 DIAGNOSIS — F331 Major depressive disorder, recurrent, moderate: Secondary | ICD-10-CM | POA: Insufficient documentation

## 2021-06-20 DIAGNOSIS — F411 Generalized anxiety disorder: Secondary | ICD-10-CM | POA: Insufficient documentation

## 2021-06-20 NOTE — Discharge Instructions (Addendum)
Take all medications as prescribed. Keep all follow-up appointments as scheduled.  Do not consume alcohol or use illegal drugs while on prescription medications. Report any adverse effects from your medications to your primary care provider promptly.  In the event of recurrent symptoms or worsening symptoms, call 911, a crisis hotline, or go to the nearest emergency department for evaluation.   
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
## 2021-06-27 ENCOUNTER — Encounter (HOSPITAL_COMMUNITY): Payer: Self-pay | Admitting: Licensed Clinical Social Worker

## 2021-06-27 ENCOUNTER — Other Ambulatory Visit: Payer: Self-pay

## 2021-06-27 ENCOUNTER — Ambulatory Visit (INDEPENDENT_AMBULATORY_CARE_PROVIDER_SITE_OTHER): Payer: No Payment, Other | Admitting: Licensed Clinical Social Worker

## 2021-06-27 DIAGNOSIS — F1994 Other psychoactive substance use, unspecified with psychoactive substance-induced mood disorder: Secondary | ICD-10-CM | POA: Diagnosis not present
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
## 2021-07-01 IMAGING — DX DG WRIST COMPLETE 3+V*R*
4 series · 4 of 4 positions shown · non-contrast
Comparison: January 10, 2017.

CLINICAL DATA: Acute right wrist pain after fall.

EXAM:
RIGHT WRIST - COMPLETE 3+ VIEW

[wrist pa]
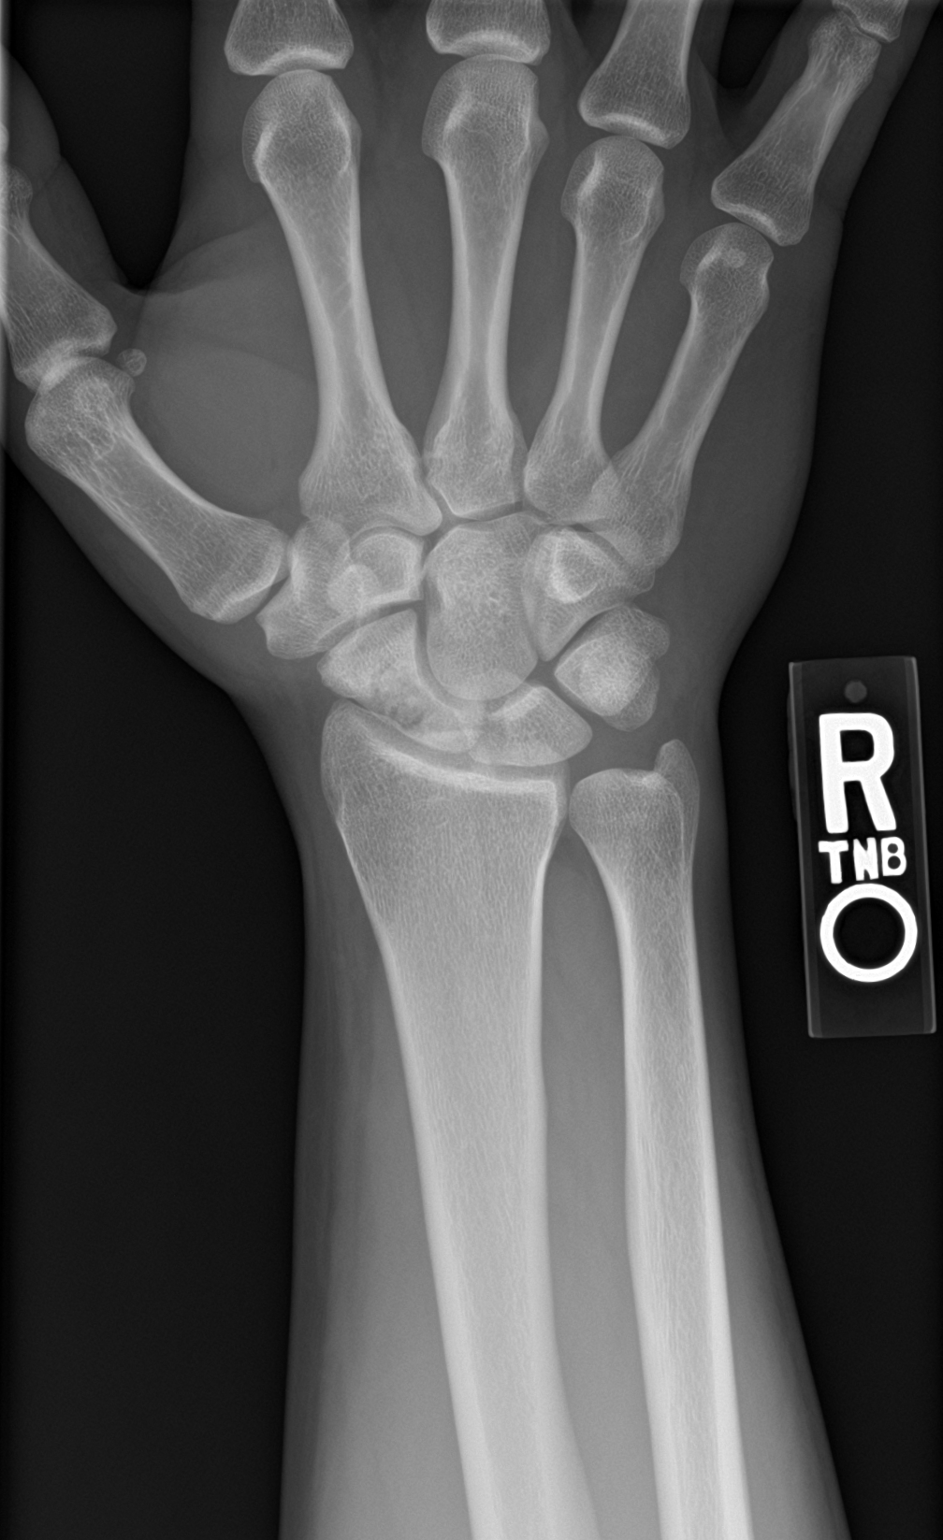

[wrist navicular]
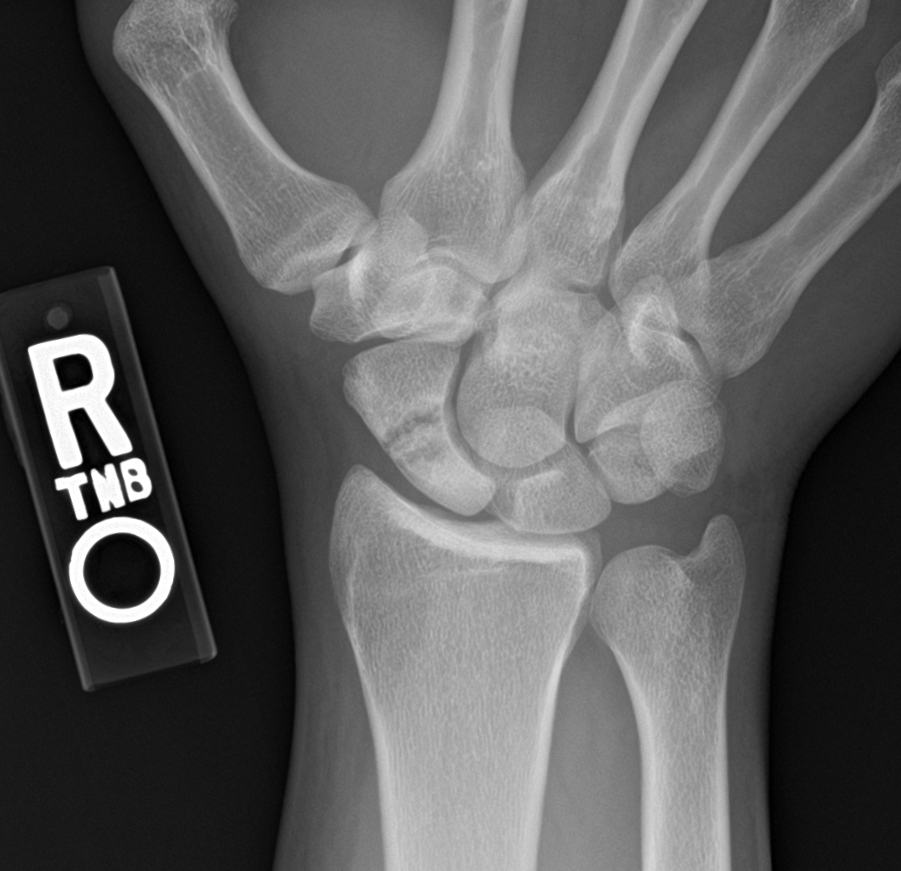

[wrist obl]
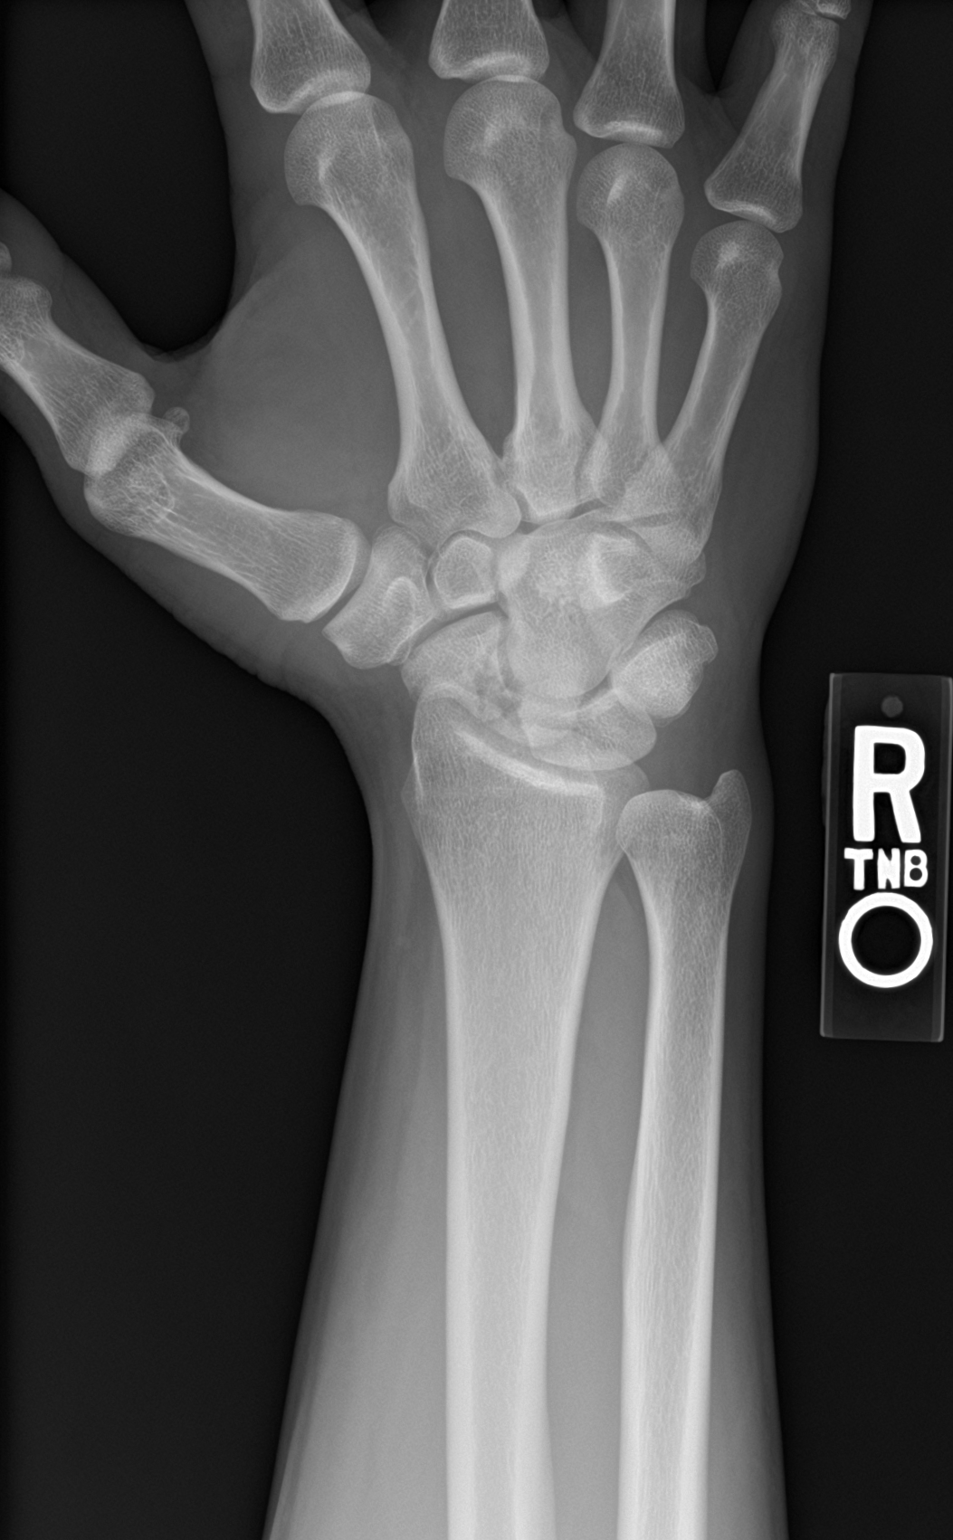

[wrist lat]
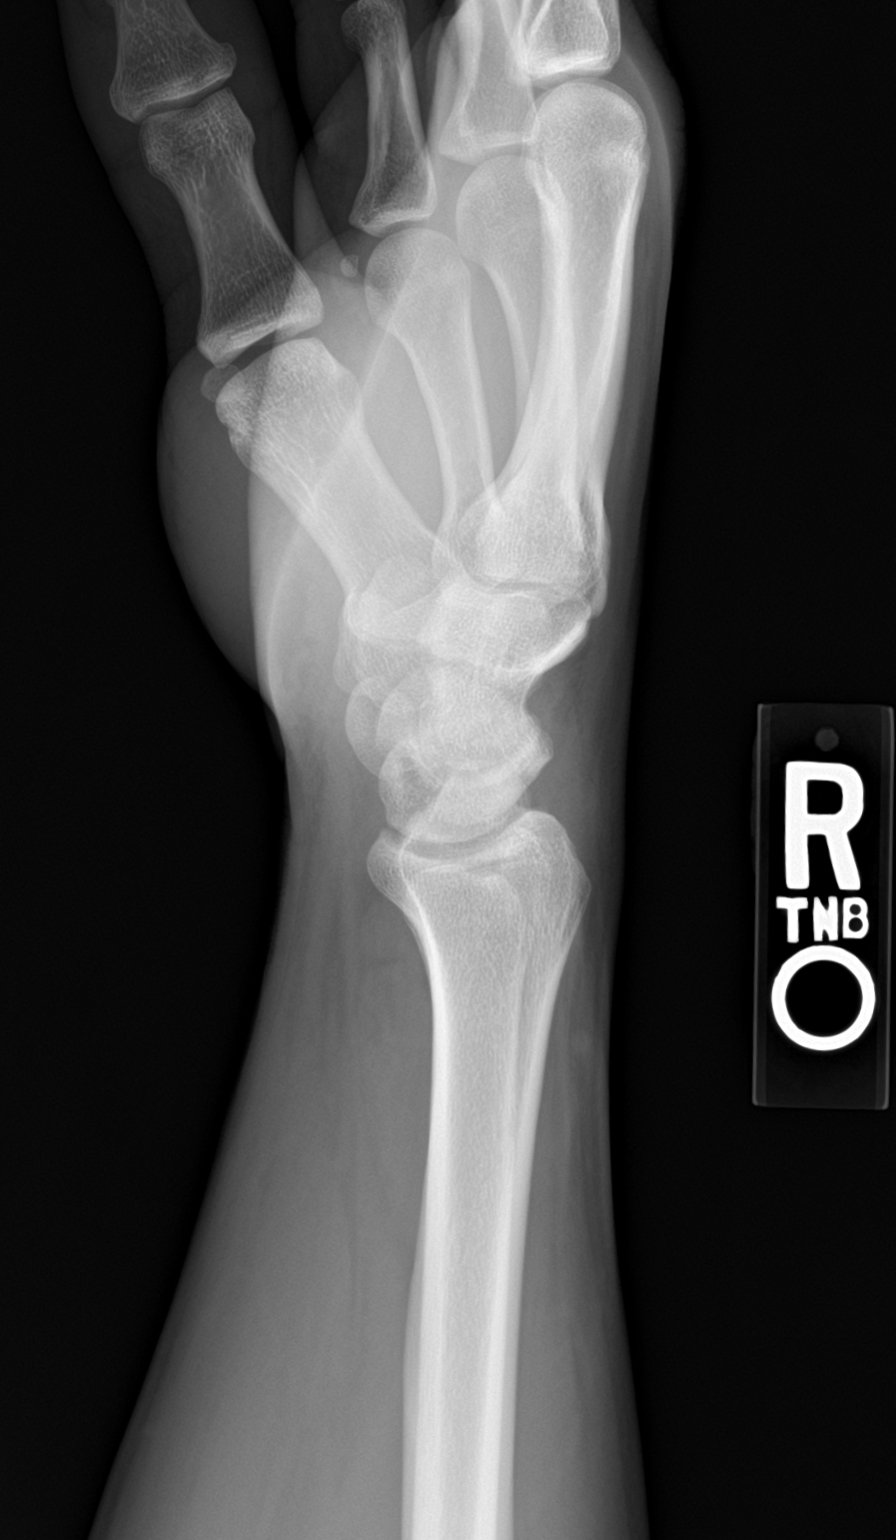

[4 of 4 positions shown; findings below may reference images not displayed]

FINDINGS: No acute fracture or dislocation is noted. Lucency and sclerosis is
seen involving the proximal pole of the scaphoid bone consistent
with avascular necrosis. Joint spaces are intact. No soft tissue
abnormality is noted.
IMPRESSION: Findings consistent with avascular necrosis of the scaphoid. No
acute abnormality is noted.

## 2021-07-27 ENCOUNTER — Ambulatory Visit (HOSPITAL_COMMUNITY): Payer: No Payment, Other | Admitting: Licensed Clinical Social Worker

## 2021-08-29 ENCOUNTER — Ambulatory Visit (HOSPITAL_COMMUNITY): Payer: No Payment, Other | Admitting: Licensed Clinical Social Worker

## 2023-08-28 ENCOUNTER — Ambulatory Visit
Admission: EM | Admit: 2023-08-28 | Discharge: 2023-08-28 | Disposition: A | Discharge status: Home / Self Care | Payer: Self-pay | Attending: Family Medicine | Admitting: Family Medicine

## 2023-08-28 DIAGNOSIS — R1010 Upper abdominal pain, unspecified: Secondary | ICD-10-CM

## 2023-08-28 DIAGNOSIS — K219 Gastro-esophageal reflux disease without esophagitis: Secondary | ICD-10-CM

## 2023-08-28 MED ORDER — OMEPRAZOLE 20 MG PO CPDR: 20.0000 mg | DELAYED_RELEASE_CAPSULE | Freq: Two times a day (BID) | ORAL | 0 refills | Status: AC

## 2023-08-28 MED ORDER — FAMOTIDINE 20 MG PO TABS: 20.0000 mg | ORAL_TABLET | Freq: Two times a day (BID) | ORAL | 0 refills | Status: AC

## 2023-08-28 NOTE — ED Triage Notes (Signed)
Pt reports, vomiting, indigestion, acid reflux, random sweating,"I feel like a 2,000 pounds",  for years, worse for the past 2 days. Reports epigastric pain after eating, salads and meat do not gives indigestion.

## 2023-08-28 NOTE — ED Provider Notes (Signed)
[facility] - URGENT CARE CENTER  Note:  This document was prepared using Conservation officer, historic buildings and may include unintentional dictation errors.  MRN: [id number] DOB: 22-Jan-1991  Subjective:   Daryl Wright is a 32 y.o. male presenting for 2-year history of acute onset upper abdominal pain, indigestion, acid reflux that leads to intermittent vomiting.  Feels a heaviness in his abdomen and can become very sweaty.  Symptoms primarily occur after eating.  Not all foods and up causing his symptoms however.  He has an occasional alcoholic drink, 1/week.  He does vape or smoke marijuana on occasion as well but definitely not daily or multiple times a week.  No fever, recent antibiotic use, bloody stools, hospitalizations or long distance travel.  Has not eaten raw foods, drank unfiltered water.  No history of GI disorders including Crohn's, IBS, ulcerative colitis.   No current facility-administered medications for this encounter.  Current Outpatient Medications:    acetaminophen (TYLENOL) 500 MG tablet, Take 500 mg by mouth every 6 (six) hours as needed., Disp: , Rfl:    No Known Allergies  History reviewed. No pertinent past medical history.   Past Surgical History:  Procedure Laterality Date   WRIST SURGERY     Boen graft    Family History  Problem Relation Age of Onset   Cancer Father    Healthy Mother     Social History   Tobacco Use   Smoking status: Never   Smokeless tobacco: Never  Vaping Use   Vaping status: Every Day  Substance Use Topics   Alcohol use: Yes    Alcohol/week: 3.0 standard drinks of alcohol    Types: 3 Cans of beer per week    Comment: socailly   Drug use: Yes    Frequency: 1.0 times per week    Types: Marijuana    ROS   Objective:   Vitals: BP (!) 142/83 (BP Location: Right Arm)   Pulse 77   Temp 98.1 F (36.7 C) (Oral)   Resp 16   SpO2 95%   Physical Exam Constitutional:      General: He is not in acute distress.     Appearance: Normal appearance. He is well-developed and normal weight. He is not ill-appearing, toxic-appearing or diaphoretic.  HENT:     Head: Normocephalic and atraumatic.     Right Ear: External ear normal.     Left Ear: External ear normal.     Nose: Nose normal.     Mouth/Throat:     Mouth: Mucous membranes are moist.  Eyes:     General: No scleral icterus.       Right eye: No discharge.        Left eye: No discharge.     Extraocular Movements: Extraocular movements intact.  Cardiovascular:     Rate and Rhythm: Normal rate and regular rhythm.     Heart sounds: Normal heart sounds. No murmur heard.    No friction rub. No gallop.  Pulmonary:     Effort: Pulmonary effort is normal. No respiratory distress.     Breath sounds: Normal breath sounds. No stridor. No wheezing, rhonchi or rales.  Abdominal:     General: Bowel sounds are normal. There is no distension.     Palpations: Abdomen is soft. There is no mass.     Tenderness: There is generalized abdominal tenderness and tenderness in the epigastric area. There is no right CVA tenderness, left CVA tenderness, guarding or rebound.  Musculoskeletal:  Cervical back: Normal range of motion.  Neurological:     Mental Status: He is alert and oriented to person, place, and time.  Psychiatric:        Mood and Affect: Mood normal.        Behavior: Behavior normal.        Thought Content: Thought content normal.        Judgment: Judgment normal.      Assessment and Plan :   PDMP not reviewed this encounter.  1. Upper abdominal pain   2. Gastroesophageal reflux disease, unspecified whether esophagitis present    Low suspicion for an acute abdomen.  Patient is hemodynamically stable and will defer visit to the emergency room for emergent testing.  Placed a referral to gastroenterology for consultation, consideration for endoscopy.  In the meantime, recommend patient's start Prilosec and famotidine.  Counseled patient on  potential for adverse effects with medications prescribed/recommended today, ER and return-to-clinic precautions discussed, patient verbalized understanding.    Wallis Bamberg, [place] 08/28/23 [id number]

## 2023-10-24 ENCOUNTER — Encounter: Payer: Self-pay | Admitting: Urgent Care
